# Patient Record
Sex: Male | Born: 1950 | Race: White | Hispanic: No | Marital: Married
Health system: Southern US, Community
[De-identification: ages and names within clinical notes are randomized; demographics above are authoritative.]

---

## 2009-02-07 ENCOUNTER — Ambulatory Visit: Payer: Self-pay | Admitting: Family Medicine

## 2009-07-21 ENCOUNTER — Ambulatory Visit: Payer: Self-pay | Admitting: Family Medicine

## 2009-09-08 ENCOUNTER — Ambulatory Visit: Payer: Self-pay | Admitting: Unknown Physician Specialty

## 2010-04-06 ENCOUNTER — Ambulatory Visit: Payer: Self-pay | Admitting: Family Medicine

## 2010-08-09 ENCOUNTER — Ambulatory Visit: Payer: Self-pay | Admitting: Gastroenterology

## 2011-02-18 ENCOUNTER — Ambulatory Visit: Payer: Self-pay | Admitting: Family Medicine

## 2011-02-28 ENCOUNTER — Ambulatory Visit: Payer: Self-pay | Admitting: Family Medicine

## 2012-12-21 ENCOUNTER — Ambulatory Visit: Payer: Self-pay | Admitting: Family Medicine

## 2012-12-31 ENCOUNTER — Ambulatory Visit: Payer: Self-pay | Admitting: Family Medicine

## 2013-03-03 ENCOUNTER — Ambulatory Visit: Payer: Self-pay | Admitting: Gastroenterology

## 2013-03-04 LAB — PATHOLOGY REPORT

## 2013-05-04 ENCOUNTER — Ambulatory Visit: Payer: Self-pay | Admitting: Family Medicine

## 2014-07-02 ENCOUNTER — Ambulatory Visit: Payer: Self-pay | Admitting: Gastroenterology

## 2014-08-19 ENCOUNTER — Ambulatory Visit: Payer: Self-pay | Admitting: Gastroenterology

## 2014-10-24 LAB — SURGICAL PATHOLOGY

## 2014-12-12 ENCOUNTER — Ambulatory Visit
Admission: RE | Admit: 2014-12-12 | Discharge: 2014-12-12 | Disposition: A | Discharge status: Home / Self Care | Payer: 59 | Source: Ambulatory Visit | Attending: Unknown Physician Specialty | Admitting: Unknown Physician Specialty

## 2014-12-12 ENCOUNTER — Other Ambulatory Visit: Payer: Self-pay | Admitting: Unknown Physician Specialty

## 2014-12-12 DIAGNOSIS — R059 Cough, unspecified: Secondary | ICD-10-CM

## 2014-12-12 DIAGNOSIS — R05 Cough: Secondary | ICD-10-CM

## 2015-11-20 ENCOUNTER — Other Ambulatory Visit: Payer: Self-pay | Admitting: Family Medicine

## 2015-11-30 ENCOUNTER — Other Ambulatory Visit: Payer: Self-pay | Admitting: Family Medicine

## 2015-11-30 NOTE — Telephone Encounter (Signed)
Patient has not been seen for a year. Please advised needs to schedule o.v. Or. CPE. Have sent 1 refill to mail order to get by until o.v.

## 2015-12-22 ENCOUNTER — Other Ambulatory Visit: Payer: Self-pay | Admitting: Family Medicine

## 2015-12-22 NOTE — Telephone Encounter (Signed)
Patient has not been seen in over a year and needs to schedule follow up before we can approve refill

## 2015-12-22 NOTE — Telephone Encounter (Signed)
Patient advised as below. [facility] [place] [facility] [place] and has established with another PCP. Patient reports this request must have been sent to us in error as he is no longer a patient here.

## 2017-02-12 IMAGING — CR DG CHEST 2V
1 series · 2 of 2 positions shown · non-contrast
Comparison: 02/28/2011

CLINICAL DATA: Persistent cough for 18 months.

EXAM:
CHEST  2 VIEW

[Series 1: dg chest 2 view · 0.14mm/px · 2 of 2 slices shown]
[im 1/2]
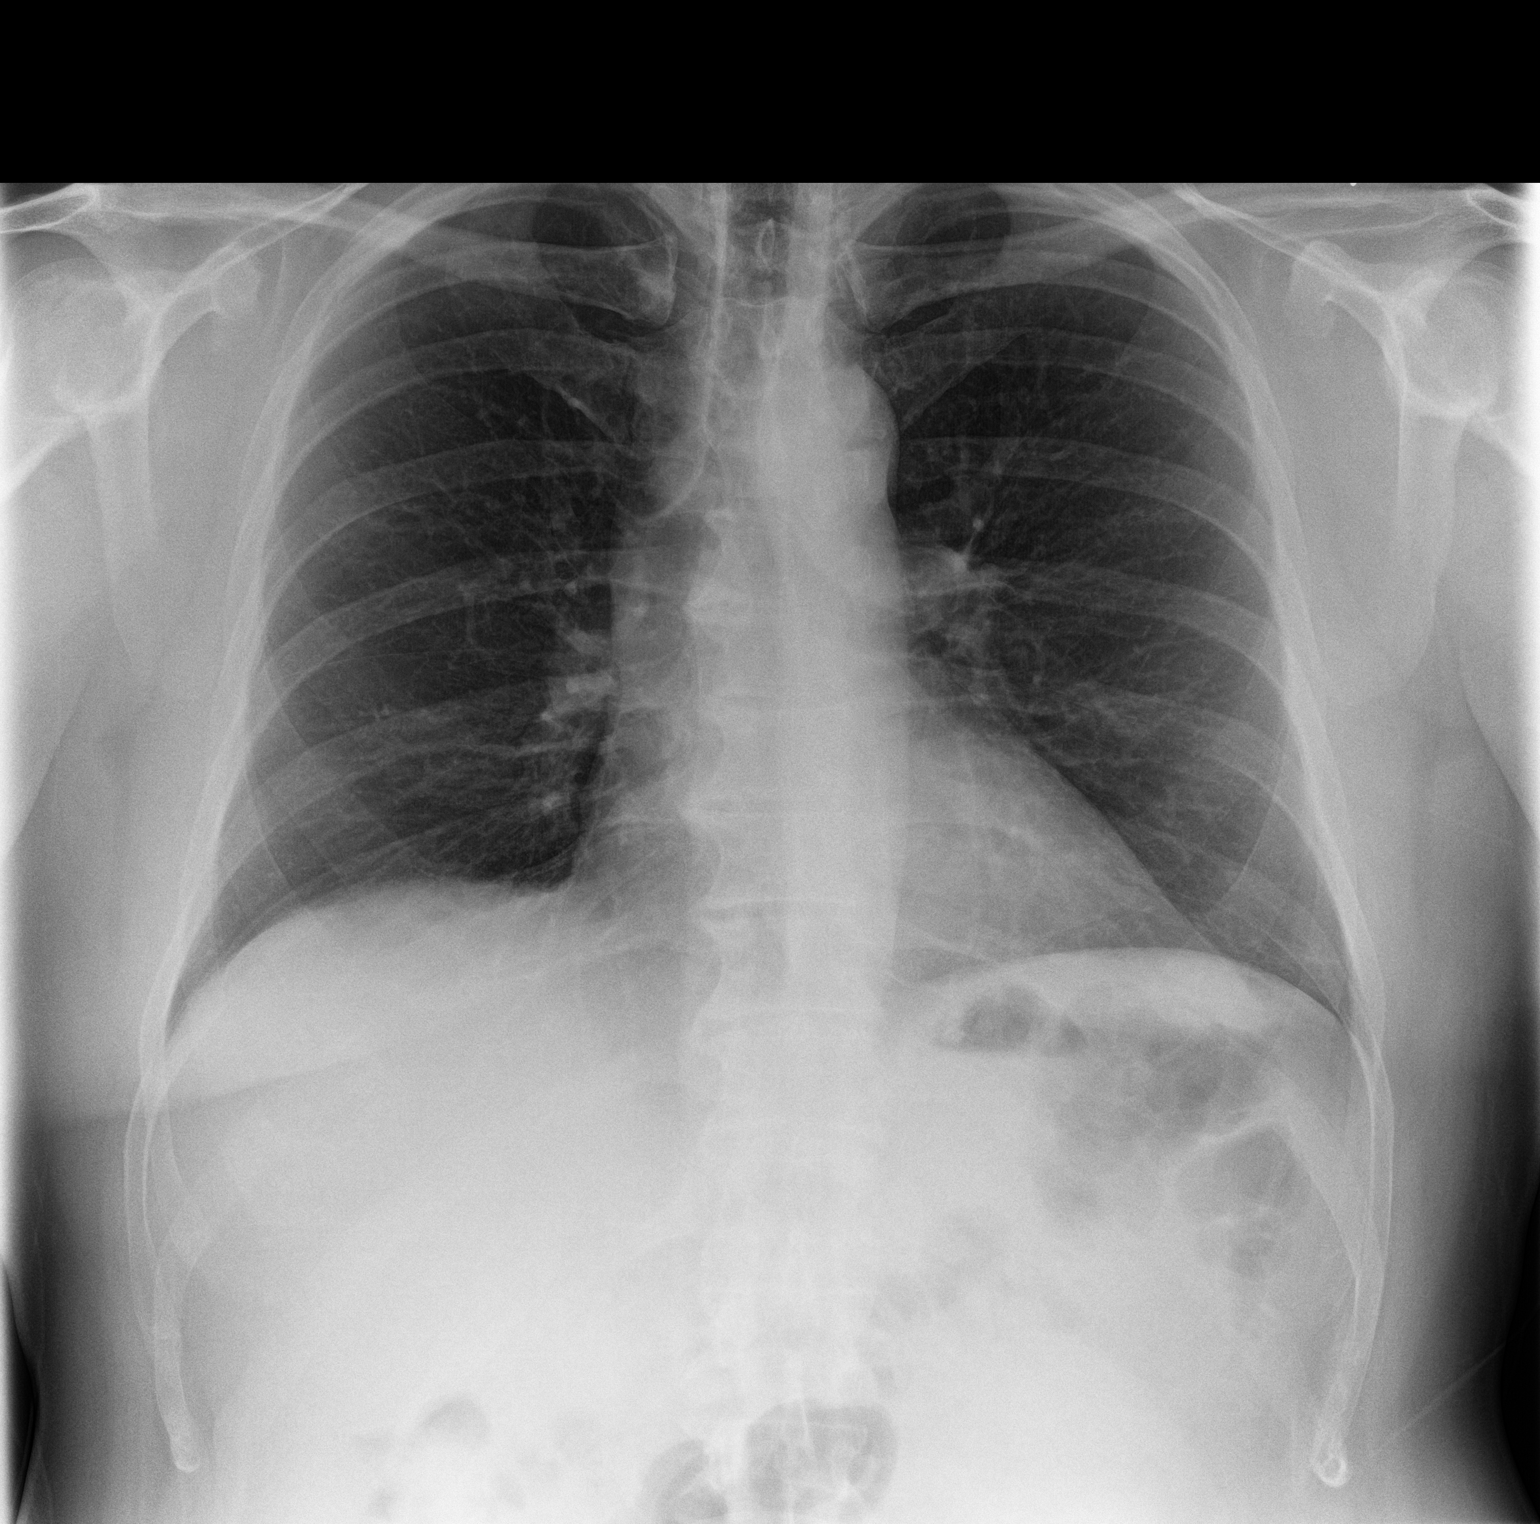
[im 2/2]
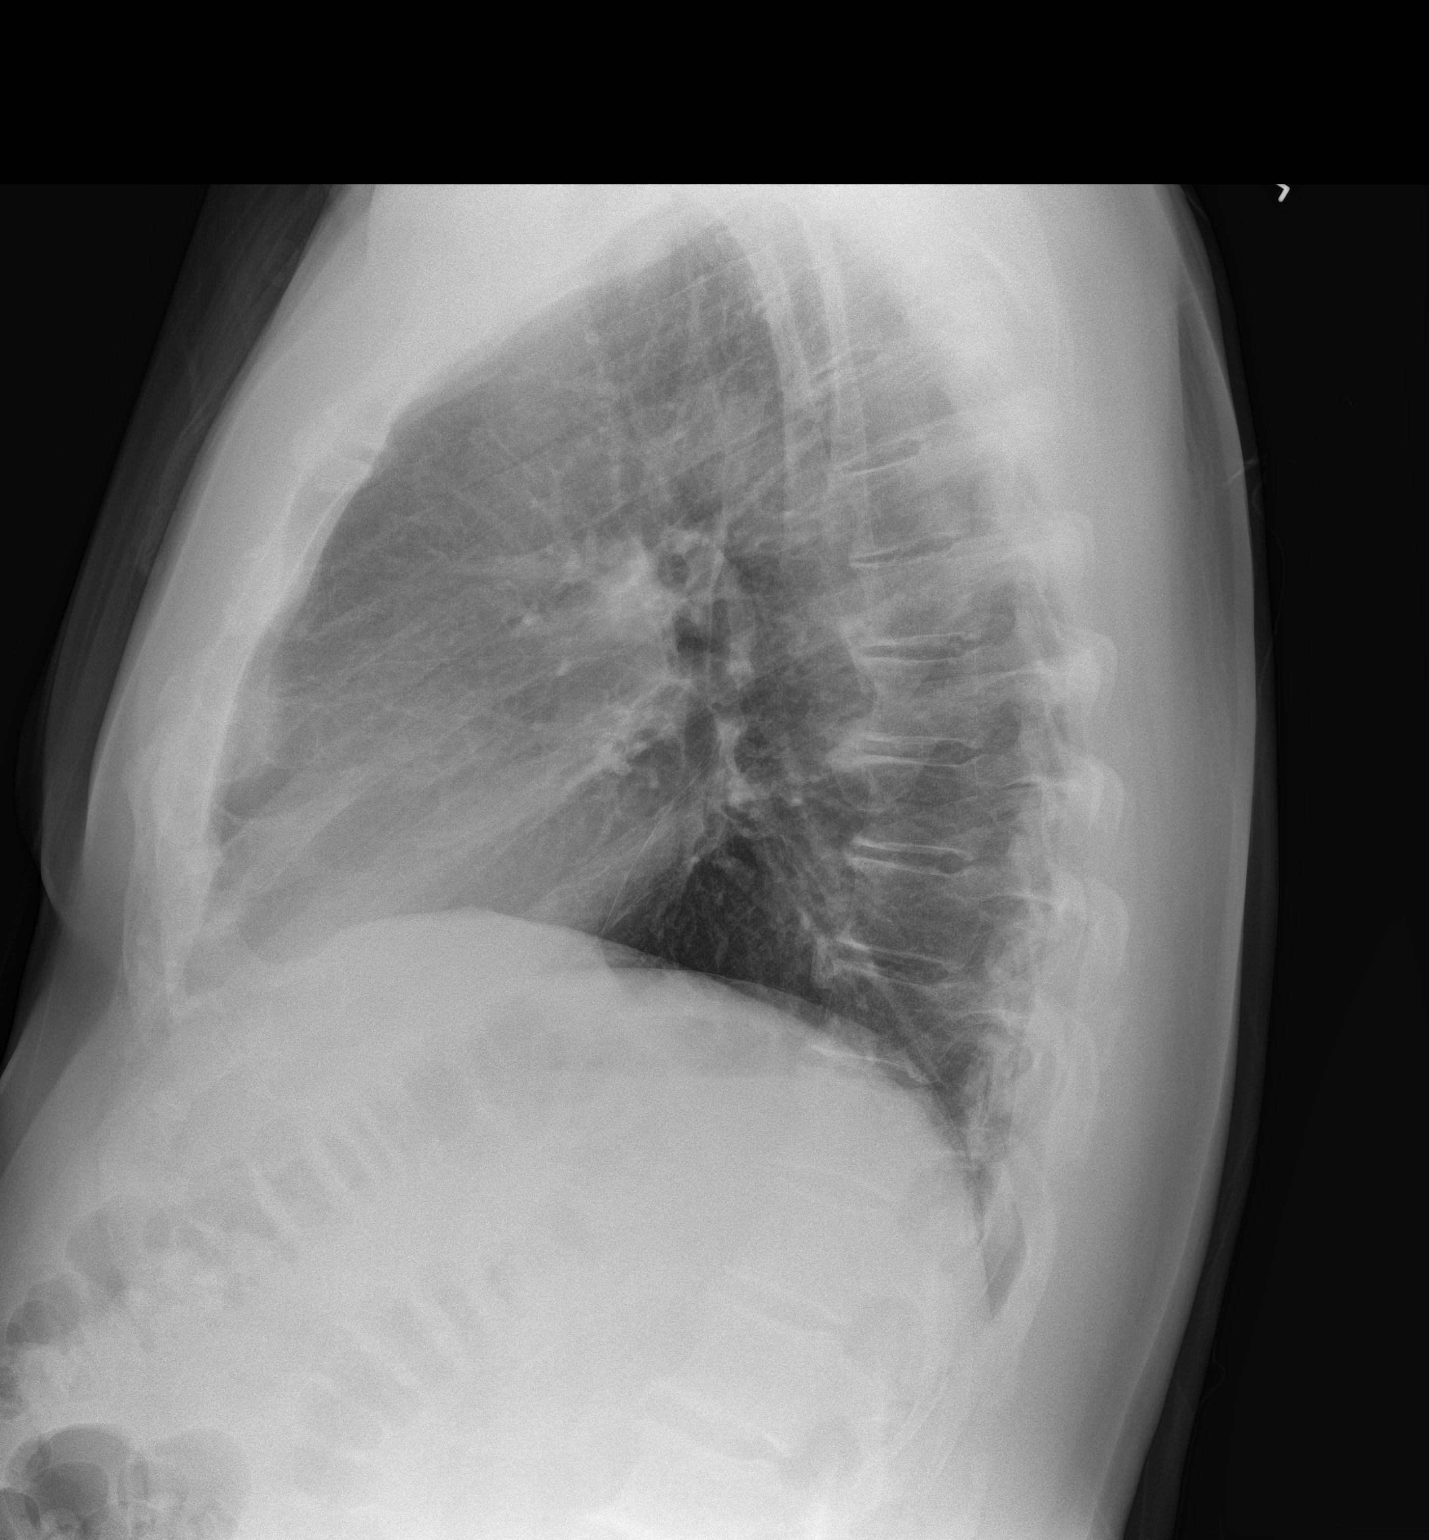

[2 of 2 positions shown; findings below may reference images not displayed]

FINDINGS: Normal heart size and mediastinal contours. No acute infiltrate or
edema. No effusion or pneumothorax. No acute osseous findings.
IMPRESSION: Negative chest.
